# Patient Record
Sex: Male | Born: 1990 | Race: White | Hispanic: No | Marital: Single | Smoking: Current every day smoker
Health system: Southern US, Community
[De-identification: ages and names within clinical notes are randomized; demographics above are authoritative.]

## PROBLEM LIST (undated history)

## (undated) DIAGNOSIS — F17200 Nicotine dependence, unspecified, uncomplicated: Secondary | ICD-10-CM

## (undated) DIAGNOSIS — Z87442 Personal history of urinary calculi: Secondary | ICD-10-CM

## (undated) DIAGNOSIS — F329 Major depressive disorder, single episode, unspecified: Secondary | ICD-10-CM

## (undated) DIAGNOSIS — F32A Depression, unspecified: Secondary | ICD-10-CM

## (undated) HISTORY — DX: Personal history of urinary calculi: Z87.442

## (undated) HISTORY — DX: Nicotine dependence, unspecified, uncomplicated: F17.200

## (undated) HISTORY — DX: Depression, unspecified: F32.A

## (undated) HISTORY — DX: Major depressive disorder, single episode, unspecified: F32.9

---
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
## 2010-06-17 ENCOUNTER — Emergency Department (HOSPITAL_COMMUNITY)
Admission: EM | Admit: 2010-06-17 | Discharge: 2010-06-17 | Disposition: A | Discharge status: Home / Self Care | Payer: PRIVATE HEALTH INSURANCE | Attending: Emergency Medicine | Admitting: Emergency Medicine

## 2010-06-17 ENCOUNTER — Emergency Department (HOSPITAL_COMMUNITY): Payer: PRIVATE HEALTH INSURANCE

## 2010-06-17 DIAGNOSIS — R0789 Other chest pain: Secondary | ICD-10-CM | POA: Insufficient documentation

## 2010-06-17 DIAGNOSIS — R0609 Other forms of dyspnea: Secondary | ICD-10-CM | POA: Insufficient documentation

## 2010-06-17 DIAGNOSIS — R059 Cough, unspecified: Secondary | ICD-10-CM | POA: Insufficient documentation

## 2010-06-17 DIAGNOSIS — R0989 Other specified symptoms and signs involving the circulatory and respiratory systems: Secondary | ICD-10-CM | POA: Insufficient documentation

## 2010-06-17 DIAGNOSIS — J40 Bronchitis, not specified as acute or chronic: Secondary | ICD-10-CM | POA: Insufficient documentation

## 2010-06-17 DIAGNOSIS — R05 Cough: Secondary | ICD-10-CM | POA: Insufficient documentation

## 2012-07-19 IMAGING — CR DG CHEST 2V
2 series · 2 of 2 positions shown · non-contrast
Comparison: 09/11/2007

CLINICAL DATA: Cough and difficulty breathing.

CHEST - 2 VIEW

[w chest pa]
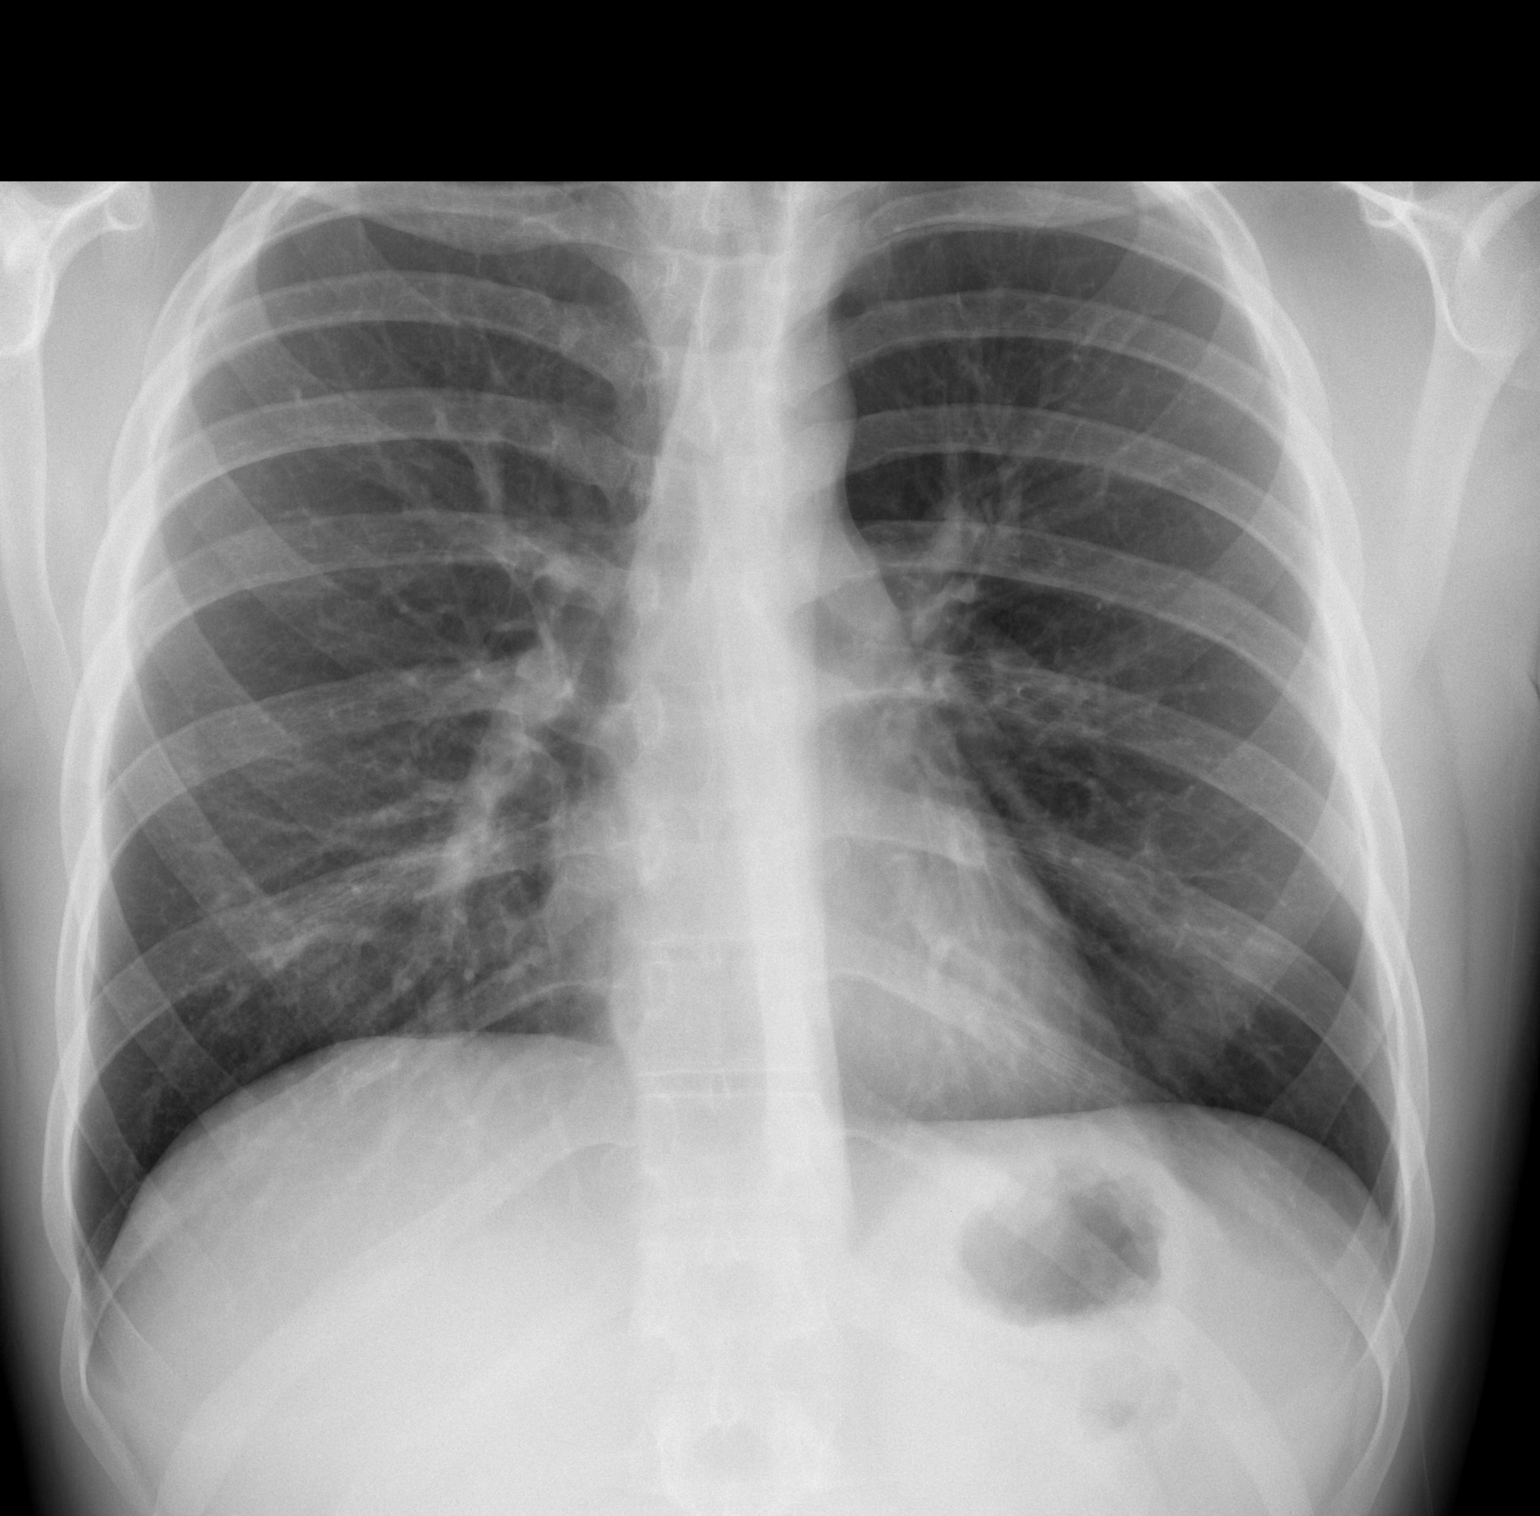

[w chest lat]
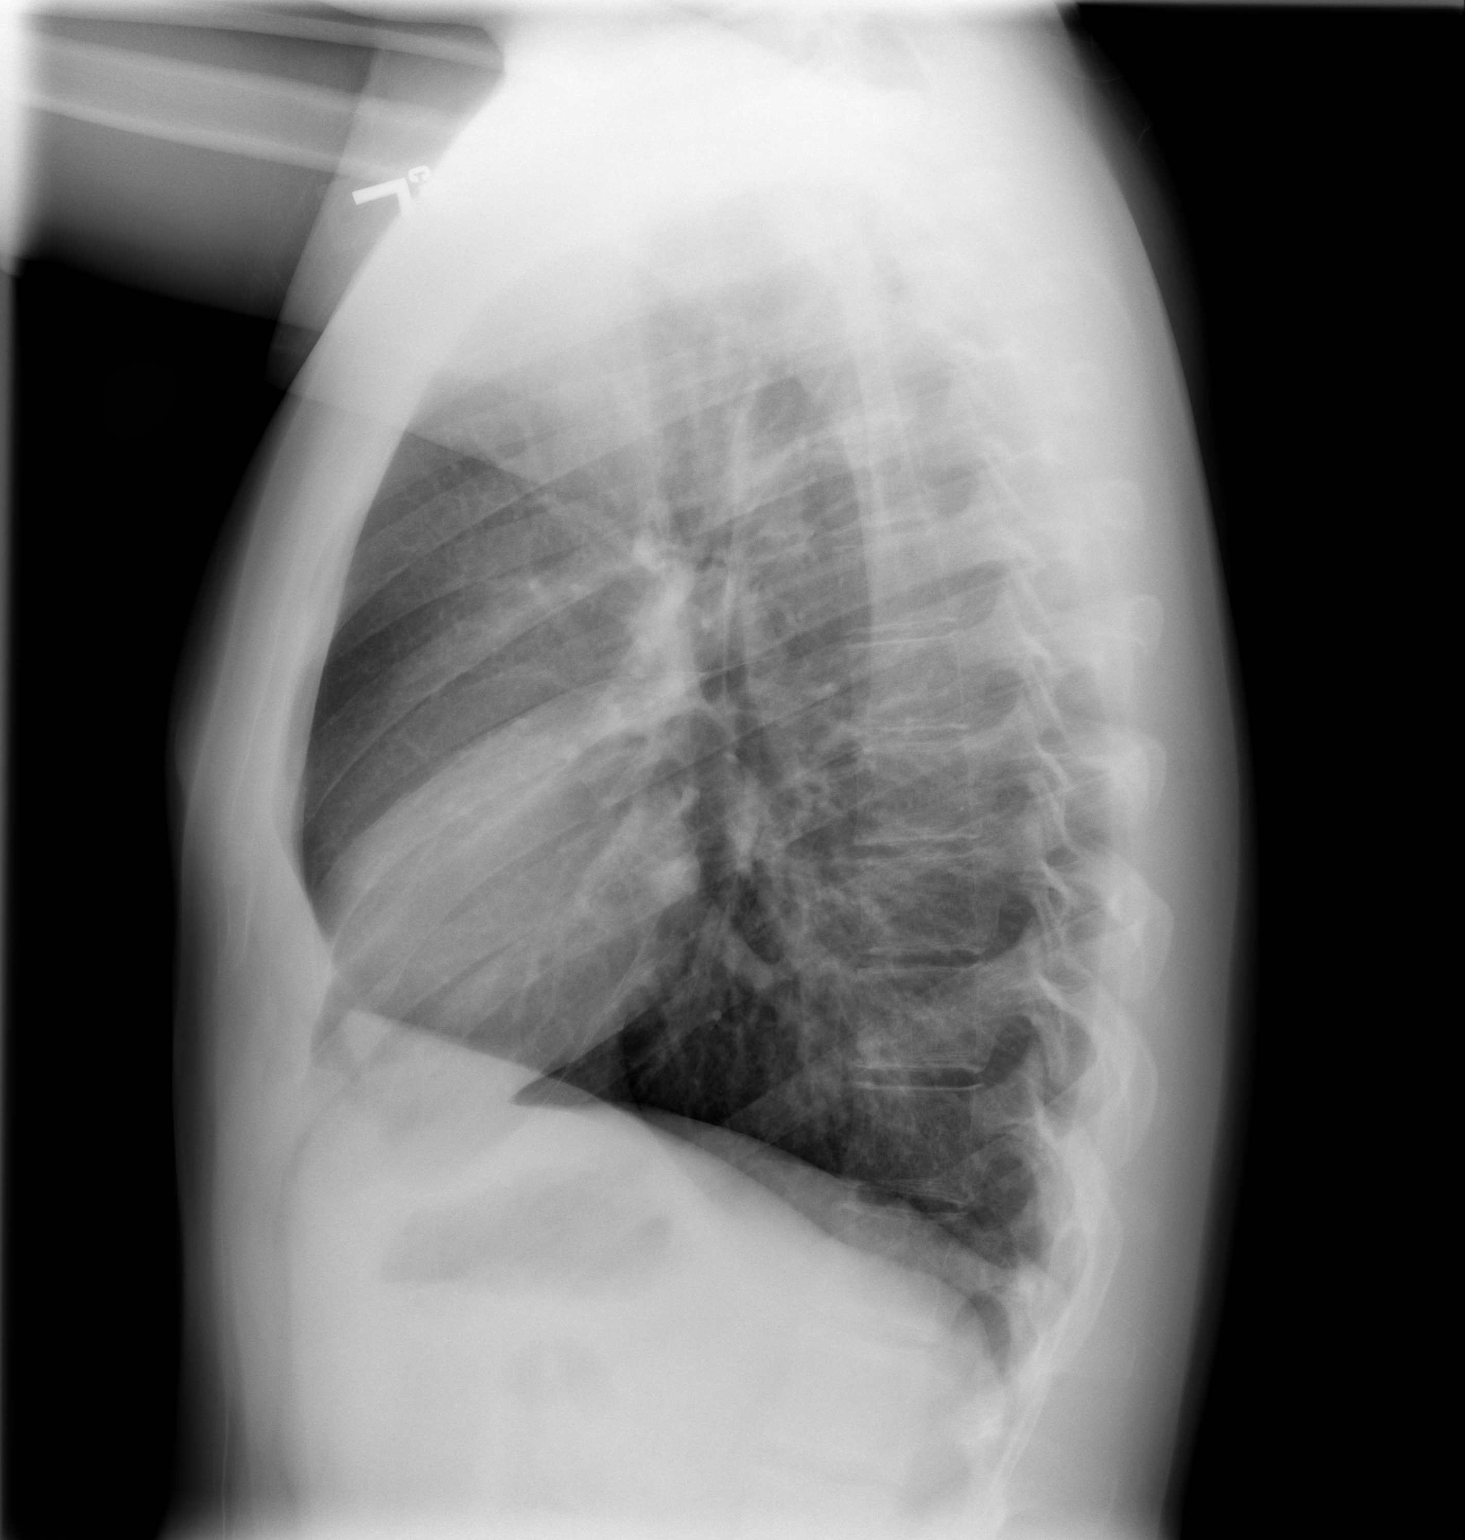

[2 of 2 positions shown; findings below may reference images not displayed]

FINDINGS: The lungs are clear without focal consolidation, edema,
effusion or pneumothorax.  Cardiopericardial silhouette is within
normal limits for size.  Imaged bony structures of the thorax are
intact.
IMPRESSION: Normal exam.

## 2013-03-15 ENCOUNTER — Ambulatory Visit (INDEPENDENT_AMBULATORY_CARE_PROVIDER_SITE_OTHER): Payer: PRIVATE HEALTH INSURANCE | Admitting: Medical

## 2013-03-15 ENCOUNTER — Encounter: Payer: Self-pay | Admitting: Medical

## 2013-03-15 VITALS — BP 100/70 | HR 84 | Temp 98.0°F | Wt 162.0 lb

## 2013-03-15 DIAGNOSIS — Z818 Family history of other mental and behavioral disorders: Secondary | ICD-10-CM

## 2013-03-15 DIAGNOSIS — F329 Major depressive disorder, single episode, unspecified: Secondary | ICD-10-CM

## 2013-03-15 DIAGNOSIS — Z202 Contact with and (suspected) exposure to infections with a predominantly sexual mode of transmission: Secondary | ICD-10-CM

## 2013-03-15 DIAGNOSIS — F32A Depression, unspecified: Secondary | ICD-10-CM

## 2013-03-15 DIAGNOSIS — F3289 Other specified depressive episodes: Secondary | ICD-10-CM

## 2013-03-15 MED ORDER — BUPROPION HCL ER (XL) 150 MG PO TB24: 150.0000 mg | ORAL_TABLET | Freq: Every day | ORAL | Status: DC

## 2013-03-15 NOTE — Progress Notes (Signed)
   Subjective:   Phillip Black is a [age] y.o. male presenting on 03/15/2013 with referred by another doctor would STD check  He was referred by Marjie SkiffLauren Atkinson, therapist at [facility].  Recently established care with counseling for depression.   Has general dissatisfaction with life's direction currently and having trouble with recent break up.   She is working with him on counseling, and called me to work together on his medication.  He is a recovering addict.  She wants him to get him back involved in a 12 step program.  He has been on Zoloft a few different times in his life.  Didn't recall it working well.  Smokes.   They mentioned him beginning Wellbutrin.  He is open to this.  He notes significant family hx/o depression, OCD.    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  Currently works as a Conservation officer, naturecashier at [employer].   [employer], [place], junior by credit hours.  Parents - mom is a NP, dad is not in the picture.  Does photography and likes to write.   He would like a routine STD check.  He notes being treated for chlamydia a few months ago. Still having itching on the inside of the penis.  Denies rash, no burning with urination, no blood in urine, but possibly swollen inguinal glands.  Had azithromycin twice.  No current sexual partner.  Last STD screen  "Been a while."  Lifetime sexual partners, > 5.  Typically uses condoms.  No other aggravating or relieving factors.    No other complaint.  Review of Systems ROS as in subjective      Objective:     Filed Vitals:   03/15/13 1508  BP: 100/70  Pulse: 84  Temp: 98 F (36.7 C)    General appearance: alert, no distress, WD/WN GU: normal male external genitalia, circumcised, no mass,no lymphadenopathy, no rash, no hernia Psych: pleasant, good eye contact, answers questions appropriately      Assessment: Encounter Diagnoses  Name Primary?  . Depression Yes  . Family history of  depression   . Venereal disease contact      Plan: Depression, family hx/o depression - discussed his concerns. Discussed his recently counseling sessions with Mrs. Alfredo BachAtkinson.  Discussed medications that may help with his symptoms.  discussed risks/benefits, and begin trial of Wellbutrin at the recommendation of Mrs. Alfredo BachAtkinson.  I will see him back in 3wk, sooner prn.  He agrees with this plan, will c/t with counseling.   Venereal disease contact - discussed safe sex, prevention, labs, and we will call with results.   Lenard GallowayMickey was seen today for referred by another doctor would std check.  Diagnoses and associated orders for this visit:  Venereal disease contact - HIV antibody - RPR - GC/Chlamydia Probe Amp  Depression  Other Orders - buPROPion (WELLBUTRIN XL) 150 MG 24 hr tablet; Take 1 tablet (150 mg total) by mouth daily.     Return 3wk.

## 2013-03-16 LAB — RPR

## 2013-03-16 LAB — GC/CHLAMYDIA PROBE AMP
CT Probe RNA: NEGATIVE
GC PROBE AMP APTIMA: NEGATIVE

## 2013-03-16 LAB — HIV ANTIBODY (ROUTINE TESTING W REFLEX): HIV: NONREACTIVE

## 2013-04-20 ENCOUNTER — Ambulatory Visit: Payer: PRIVATE HEALTH INSURANCE | Admitting: Medical

## 2013-04-21 ENCOUNTER — Ambulatory Visit: Payer: PRIVATE HEALTH INSURANCE | Admitting: Medical

## 2013-04-27 ENCOUNTER — Ambulatory Visit (INDEPENDENT_AMBULATORY_CARE_PROVIDER_SITE_OTHER): Payer: PRIVATE HEALTH INSURANCE | Admitting: Medical

## 2013-04-27 ENCOUNTER — Encounter: Payer: Self-pay | Admitting: Medical

## 2013-04-27 VITALS — BP 110/70 | HR 100 | Temp 97.4°F | Resp 16 | Wt 164.0 lb

## 2013-04-27 DIAGNOSIS — F329 Major depressive disorder, single episode, unspecified: Secondary | ICD-10-CM

## 2013-04-27 DIAGNOSIS — F3289 Other specified depressive episodes: Secondary | ICD-10-CM

## 2013-04-27 DIAGNOSIS — F32A Depression, unspecified: Secondary | ICD-10-CM

## 2013-04-27 DIAGNOSIS — R300 Dysuria: Secondary | ICD-10-CM

## 2013-04-27 LAB — POCT URINALYSIS DIPSTICK
Bilirubin, UA: NEGATIVE
GLUCOSE UA: NEGATIVE
Ketones, UA: NEGATIVE
Nitrite, UA: NEGATIVE
SPEC GRAV UA: 1.015
UROBILINOGEN UA: NEGATIVE
pH, UA: 6

## 2013-04-27 MED ORDER — BUPROPION HCL ER (XL) 300 MG PO TB24: 300.0000 mg | ORAL_TABLET | Freq: Every day | ORAL | Status: DC

## 2013-04-27 NOTE — Progress Notes (Signed)
Subjective:   Phillip Black is a 23 y.o. male presenting on 04/27/2013 with Follow-up  At last visit we began Wellbutrin 150mg  XL and he is having improvement.   He feels like his level or depth of depression isn't as bad.  Denies any side effects on the medication.   Sleep and appetite ok.  Has somewhat decreased desire for tobacco.  Not involved in church at the moment.  Interested in Phillip Black as career.    [place] in [place] in.    History from last visit: He was referred by Phillip SkiffLauren Black, therapist at [facility].  Recently established care with counseling for depression.   Has general dissatisfaction with life's direction currently and having trouble with recent break up.   She is working with him on counseling, and called me to work together on his medication.  He is a recovering addict.  She wants him to get him back involved in a 12 step program.  He has been on Zoloft a few different times in his life.  Didn't recall it working well.  Smokes.   They mentioned him beginning Wellbutrin.  He is open to this.  He notes significant family hx/o depression, OCD.    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  Currently works as a Conservation officer, naturecashier at [employer].   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  Parents - mom is a NP, dad is not in the picture.  Does photography and likes to write.   Despite recent normal STD screen, still having itching on the inside of the penis.  Denies rash, no burning with urination, no blood in urine, but possibly swollen inguinal glands.  No current sexual partner.  Last STD screen   "Been a while."  Lifetime sexual partners, > 5.  Does drink lots of coffee throughout the day.  Typically uses condoms.  No other aggravating or relieving factors.    No other complaint.  Review of Systems ROS as in subjective      Objective:     Filed Vitals:   04/27/13 1510  BP: 110/70  Pulse:  100  Temp: 97.4 F (36.3 C)  Resp: 16    General appearance: alert, no distress, WD/WN Psych: pleasant, good eye contact, answers questions appropriately      Assessment: Encounter Diagnoses  Name Primary?  . Depression Yes  . Dysuria      Plan: Depression, family hx/o depression - increase to Wellbutrin 300mg  XL, discussed medication, treatment, c/t with counseling.   Dysuria - possibly caffeine related, but urinalysis somewhat abnormal, send for culture.  Phillip GallowayMickey was seen today for follow-up.  Diagnoses and associated orders for this visit:  Depression  Dysuria - Urine culture  Other Orders - buPROPion (WELLBUTRIN XL) 300 MG 24 hr tablet; Take 1 tablet (300 mg total) by mouth daily.     Return pending culture, call back 3-4 wk.

## 2013-04-27 NOTE — Addendum Note (Signed)
Addended by: Janeice RobinsonSCALES, Jaquavis Felmlee L on: 04/27/2013 04:06 PM   Modules accepted: Orders

## 2013-04-29 LAB — URINE CULTURE
COLONY COUNT: NO GROWTH
Organism ID, Bacteria: NO GROWTH

## 2013-08-04 ENCOUNTER — Other Ambulatory Visit: Payer: Self-pay | Admitting: Medical

## 2018-09-15 ENCOUNTER — Other Ambulatory Visit: Payer: Self-pay | Admitting: Emergency Medicine

## 2018-09-15 DIAGNOSIS — Z20822 Contact with and (suspected) exposure to covid-19: Secondary | ICD-10-CM

## 2018-09-16 LAB — NOVEL CORONAVIRUS, NAA: SARS-CoV-2, NAA: NOT DETECTED

## 2018-12-06 ENCOUNTER — Ambulatory Visit (HOSPITAL_COMMUNITY)
Admission: EM | Admit: 2018-12-06 | Discharge: 2018-12-06 | Disposition: A | Discharge status: Home / Self Care | Payer: BLUE CROSS/BLUE SHIELD | Attending: Family Medicine | Admitting: Family Medicine

## 2018-12-06 ENCOUNTER — Encounter (HOSPITAL_COMMUNITY): Payer: Self-pay

## 2018-12-06 ENCOUNTER — Other Ambulatory Visit: Payer: Self-pay

## 2018-12-06 DIAGNOSIS — Z20828 Contact with and (suspected) exposure to other viral communicable diseases: Secondary | ICD-10-CM | POA: Diagnosis not present

## 2018-12-06 DIAGNOSIS — Z20822 Contact with and (suspected) exposure to covid-19: Secondary | ICD-10-CM

## 2018-12-06 NOTE — Discharge Instructions (Addendum)
The test result will be available on My Chart

## 2018-12-06 NOTE — ED Triage Notes (Signed)
Pt presents to [facility] stating he would like a covid test before traveling in a few days. Pt states he has no symptoms, just a slight scratchy throat this morning.

## 2018-12-06 NOTE — ED Provider Notes (Signed)
[facility]    CSN: [id number] Arrival date & time: 12/06/18  1329      History   Chief Complaint Chief Complaint  Patient presents with  . covid test    HPI Phillip Black is a 28 y.o. male.   HPI  Requesting a covid test No known exposure Went to an event that was "questionable" Has a slight scratchy throat this morning Plans to travel for thanksgiving  Past Medical History:  Diagnosis Date  . Depression   . History of kidney stones   . Smoker     There are no active problems to display for this patient.   History reviewed. No pertinent surgical history.     Home Medications    Prior to Admission medications   Medication Sig Start Date End Date Taking? Authorizing Provider  FLUoxetine (PROZAC) 40 MG capsule Take 40 mg by mouth daily.   Yes [provider]  buPROPion (WELLBUTRIN XL) 300 MG 24 hr tablet TAKE 1 TABLET BY MOUTH EVERY DAY 08/04/13   Tysinger, Camelia Eng, PA-C    Family History Family History  Problem Relation Age of Onset  . Healthy Mother   . Healthy Father     Social History Social History   Tobacco Use  . Smoking status: Current Every Day Smoker  . Smokeless tobacco: Never Used  . Tobacco comment: vaping  Substance Use Topics  . Alcohol use: No  . Drug use: No     Allergies   Patient has no known allergies.   Review of Systems Review of Systems  Constitutional: Negative for chills and fever.  HENT: Positive for sore throat. Negative for ear pain.   Eyes: Negative for pain and visual disturbance.  Respiratory: Negative for cough and shortness of breath.   Cardiovascular: Negative for chest pain and palpitations.  Gastrointestinal: Negative for abdominal pain and vomiting.  Genitourinary: Negative for dysuria and hematuria.  Musculoskeletal: Negative for arthralgias and back pain.  Skin: Negative for color change and rash.  Neurological: Negative for seizures and syncope.  All other systems reviewed  and are negative.    Physical Exam Triage Vital Signs ED Triage Vitals  Enc Vitals Group     BP 12/06/18 1420 (!) 138/95     Pulse Rate 12/06/18 1420 75     Resp 12/06/18 1420 16     Temp 12/06/18 1420 97.8 F (36.6 C)     Temp Source 12/06/18 1420 Oral     SpO2 12/06/18 1420 100 %     Weight --      Height --      Head Circumference --      Peak Flow --      Pain Score 12/06/18 1422 0     Pain Loc --      Pain Edu? --      Excl. in [place]? --    No data found.  Updated Vital Signs BP (!) 138/95 (BP Location: Left Arm)   Pulse 75   Temp 97.8 F (36.6 C) (Oral)   Resp 16   SpO2 100%      Physical Exam Constitutional:      General: He is not in acute distress.    Appearance: He is well-developed.  HENT:     Head: Normocephalic and atraumatic.  Eyes:     Conjunctiva/sclera: Conjunctivae normal.     Pupils: Pupils are equal, round, and reactive to light.  Neck:     Musculoskeletal: Normal range  of motion.  Cardiovascular:     Rate and Rhythm: Normal rate.  Pulmonary:     Effort: Pulmonary effort is normal. No respiratory distress.  Abdominal:     General: There is no distension.     Palpations: Abdomen is soft.  Musculoskeletal: Normal range of motion.  Skin:    General: Skin is warm and dry.  Neurological:     Mental Status: He is alert.   here for testing. No symptoms to report   UC Treatments / Results  Labs (all labs ordered are listed, but only abnormal results are displayed) Labs Reviewed  NOVEL CORONAVIRUS, NAA (HOSP ORDER, SEND-OUT TO REF LAB; TAT 18-24 HRS)    EKG   Radiology No results found.  Procedures Procedures (including critical care time)  Medications Ordered in UC Medications - No data to display  Initial Impression / Assessment and Plan / UC Course  I have reviewed the triage vital signs and the nursing notes.  Pertinent labs & imaging results that were available during my care of the patient were reviewed by me and  considered in my medical decision making (see chart for details).      Final Clinical Impressions(s) / UC Diagnoses   Final diagnoses:  Encounter for screening laboratory testing for COVID-19 virus     Discharge Instructions     The test result will be available on My Chart   ED Prescriptions    None     PDMP not reviewed this encounter.   Eustace Moore, MD 12/06/18 1455

## 2018-12-08 ENCOUNTER — Ambulatory Visit (HOSPITAL_COMMUNITY)
Admission: EM | Admit: 2018-12-08 | Discharge: 2018-12-08 | Disposition: A | Discharge status: Home / Self Care | Payer: BLUE CROSS/BLUE SHIELD

## 2018-12-08 LAB — NOVEL CORONAVIRUS, NAA (HOSP ORDER, SEND-OUT TO REF LAB; TAT 18-24 HRS): SARS-CoV-2, NAA: NOT DETECTED

## 2018-12-08 NOTE — ED Notes (Signed)
Patient arrived on accident by registration.
